# Patient Record
Sex: Male | Born: 2000 | Race: White | Hispanic: No | Marital: Single | Smoking: Current every day smoker
Health system: Southern US, Community
[De-identification: ages and names within clinical notes are randomized; demographics above are authoritative.]

## PROBLEM LIST (undated history)

## (undated) DIAGNOSIS — F419 Anxiety disorder, unspecified: Secondary | ICD-10-CM

## (undated) HISTORY — PX: OTHER SURGICAL HISTORY: SHX169

---
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
## 2019-04-12 ENCOUNTER — Other Ambulatory Visit: Payer: Self-pay

## 2019-04-12 ENCOUNTER — Emergency Department (HOSPITAL_COMMUNITY)
Admission: EM | Admit: 2019-04-12 | Discharge: 2019-04-12 | Disposition: A | Discharge status: Home / Self Care | Payer: No Typology Code available for payment source | Attending: Emergency Medicine | Admitting: Emergency Medicine

## 2019-04-12 ENCOUNTER — Encounter (HOSPITAL_COMMUNITY): Payer: Self-pay

## 2019-04-12 ENCOUNTER — Emergency Department (HOSPITAL_COMMUNITY): Payer: Self-pay | Attending: Physician Assistant

## 2019-04-12 DIAGNOSIS — Y990 Civilian activity done for income or pay: Secondary | ICD-10-CM | POA: Insufficient documentation

## 2019-04-12 DIAGNOSIS — Y9389 Activity, other specified: Secondary | ICD-10-CM | POA: Diagnosis not present

## 2019-04-12 DIAGNOSIS — T1511XA Foreign body in conjunctival sac, right eye, initial encounter: Secondary | ICD-10-CM | POA: Diagnosis not present

## 2019-04-12 DIAGNOSIS — Y9289 Other specified places as the place of occurrence of the external cause: Secondary | ICD-10-CM | POA: Insufficient documentation

## 2019-04-12 DIAGNOSIS — S0591XA Unspecified injury of right eye and orbit, initial encounter: Secondary | ICD-10-CM | POA: Diagnosis present

## 2019-04-12 DIAGNOSIS — W311XXA Contact with metalworking machines, initial encounter: Secondary | ICD-10-CM | POA: Diagnosis not present

## 2019-04-12 DIAGNOSIS — T1591XA Foreign body on external eye, part unspecified, right eye, initial encounter: Secondary | ICD-10-CM

## 2019-04-12 MED ORDER — ACETAMINOPHEN 500 MG PO TABS
1000.0000 mg | ORAL_TABLET | Freq: Once | ORAL | Status: AC
  Administered 2019-04-12: 11:00:00 1000 mg via ORAL
  Filled 2019-04-12: qty 2

## 2019-04-12 MED ORDER — IBUPROFEN 400 MG PO TABS
400.0000 mg | ORAL_TABLET | Freq: Once | ORAL | Status: AC
  Administered 2019-04-12: 11:00:00 400 mg via ORAL
  Filled 2019-04-12: qty 1

## 2019-04-12 MED ORDER — TOBRAMYCIN 0.3 % OP SOLN
2.0000 [drp] | Freq: Once | OPHTHALMIC | Status: AC
  Administered 2019-04-12: 11:00:00 2 [drp] via OPHTHALMIC
  Filled 2019-04-12: qty 5

## 2019-04-12 MED ORDER — FLUORESCEIN SODIUM 1 MG OP STRP
1.0000 | ORAL_STRIP | Freq: Once | OPHTHALMIC | Status: AC
  Administered 2019-04-12: 09:00:00 1 via OPHTHALMIC
  Filled 2019-04-12: qty 1

## 2019-04-12 MED ORDER — TETRACAINE HCL 0.5 % OP SOLN
2.0000 [drp] | Freq: Once | OPHTHALMIC | Status: AC
  Administered 2019-04-12: 2 [drp] via OPHTHALMIC
  Filled 2019-04-12: qty 4

## 2019-04-12 NOTE — Discharge Instructions (Addendum)
He had a small piece of metal lodged in your right eye.  This was removed.  Only a portion of the rust ring was removed.  Please see the ophthalmology specialist Dr Manuella Ghazi for assistance with removal of this rust ring.  Please use 2 drops of tobramycin to the right eye every 4 hours for the next 5 days.  Please use dark glasses over the next few days to improve the pain when you change from light to dark, or dark to light settings.  Use Tylenol every 4 hours or ibuprofen every 6 hours for headache.  Please return to the emergency department if any emergent changes in your condition, or changes in your symptoms.

## 2019-04-12 NOTE — ED Triage Notes (Signed)
Pt works at [employer] and sent to ED due to piece of metal in right eye since yesterday. Pt will needs drug screens. Right eye noted to be red and watery

## 2019-04-12 NOTE — ED Provider Notes (Signed)
[facility] EMERGENCY DEPARTMENT Provider Note   CSN: [id number] Arrival date & time: 04/12/19  [id number]     History   Chief Complaint Chief Complaint  Patient presents with  . Eye Pain    HPI Roy Freeman is a 18 y.o. male.     Patient is a 18 year old male who presents to the emergency department with complaint of eye pain and injury.  The patient states that on yesterday October 6 he was grinding some metal.  I piece of metal, or shaving went into his right eye.  He irrigated the area, but continued to have watering of the eye.  He had pain and watering of the eye during the night last night.  Today he had increased redness.  He has some photophobia present, and some increasing pain.  He states that his tetanus status is up-to-date.  Review of medications is negative for any anticoagulation medications.  There is no noted previous eye surgery noted.  The patient presents now for assistance with this issue.  The history is provided by the patient.    History reviewed. No pertinent past medical history.  There are no active problems to display for this patient.   Past Surgical History:  Procedure Laterality Date  . spleen sx due to cyst          Home Medications    Prior to Admission medications   Not on File    Family History No family history on file.  Social History Social History   Tobacco Use  . Smoking status: Never Smoker  Substance Use Topics  . Alcohol use: Not Currently  . Drug use: Never     Allergies   Patient has no allergy information on record.   Review of Systems Review of Systems  Constitutional: Negative for activity change and appetite change.  HENT: Negative for congestion, ear discharge, ear pain, facial swelling, nosebleeds, rhinorrhea, sneezing and tinnitus.   Eyes: Positive for photophobia, pain and redness. Negative for discharge.  Respiratory: Negative for cough, choking, shortness of breath and wheezing.    Cardiovascular: Negative for chest pain, palpitations and leg swelling.  Gastrointestinal: Negative for abdominal pain, blood in stool, constipation, diarrhea, nausea and vomiting.  Genitourinary: Negative for difficulty urinating, dysuria, flank pain, frequency and hematuria.  Musculoskeletal: Negative for back pain, gait problem, myalgias and neck pain.  Skin: Negative for color change, rash and wound.  Neurological: Negative for dizziness, seizures, syncope, facial asymmetry, speech difficulty, weakness and numbness.  Hematological: Negative for adenopathy. Does not bruise/bleed easily.  Psychiatric/Behavioral: Negative for agitation, confusion, hallucinations, self-injury and suicidal ideas. The patient is not nervous/anxious.      Physical Exam Updated Vital Signs BP 117/77 (BP Location: Left Arm)   Pulse 72   Temp 98.2 F (36.8 C) (Oral)   Resp 16   Ht 6\' 1"  (1.854 m)   Wt 65.8 kg   SpO2 98%   BMI 19.13 kg/m   Physical Exam Vitals signs and nursing note reviewed.  Constitutional:      Appearance: He is well-developed. He is not toxic-appearing.  HENT:     Head: Normocephalic.     Right Ear: Tympanic membrane and external ear normal.     Left Ear: Tympanic membrane and external ear normal.  Eyes:     General: Lids are normal. Lids are everted, no foreign bodies appreciated. No visual field deficit.    Extraocular Movements: Extraocular movements intact.     Conjunctiva/sclera:  Right eye: Right conjunctiva is injected.     Left eye: Left conjunctiva is not injected.     Pupils: Pupils are equal, round, and reactive to light.  Neck:     Musculoskeletal: Normal range of motion and neck supple.     Vascular: No carotid bruit.  Cardiovascular:     Rate and Rhythm: Normal rate and regular rhythm.     Pulses: Normal pulses.     Heart sounds: Normal heart sounds.  Pulmonary:     Effort: No respiratory distress.     Breath sounds: Normal breath sounds.  Abdominal:      General: Bowel sounds are normal.     Palpations: Abdomen is soft.     Tenderness: There is no abdominal tenderness. There is no guarding.  Musculoskeletal: Normal range of motion.  Lymphadenopathy:     Head:     Right side of head: No submandibular adenopathy.     Left side of head: No submandibular adenopathy.     Cervical: No cervical adenopathy.  Skin:    General: Skin is warm and dry.  Neurological:     Mental Status: He is alert and oriented to person, place, and time.     Cranial Nerves: No cranial nerve deficit.     Sensory: No sensory deficit.  Psychiatric:        Speech: Speech normal.      ED Treatments / Results  Labs (all labs ordered are listed, but only abnormal results are displayed) Labs Reviewed - No data to display  EKG None  Radiology No results found.  Procedures .Foreign Body Removal  Date/Time: 04/12/2019 10:35 AM Performed by: Ivery Quale, PA-C Authorized by: Ivery Quale, PA-C  Consent: Verbal consent obtained. Consent given by: patient Patient understanding: patient states understanding of the procedure being performed Imaging studies: imaging studies available Patient identity confirmed: arm band Time out: Immediately prior to procedure a "time out" was called to verify the correct patient, procedure, equipment, support staff and site/side marked as required. Body area: eye Location details: right conjunctiva  Anesthesia: Local Anesthetic: tetracaine drops Patient restrained: no Patient cooperative: yes Localization method: slit lamp Removal mechanism: 27-gauge needle Eye examined with fluorescein. Corneal abrasion size: medium Corneal abrasion location: central Residual rust ring present. Residual rust ring removed: attempt to remove rust ring aborted. Dressing: antibiotic drops Depth: embedded Complexity: complex 1 objects recovered. Objects recovered: metal shaving Post-procedure assessment: foreign body removed  (residual rust ring not removed.) Patient tolerance: patient tolerated the procedure well with no immediate complications   (including critical care time)  Medications Ordered in ED Medications - No data to display   Initial Impression / Assessment and Plan / ED Course  I have reviewed the triage vital signs and the nursing notes.  Pertinent labs & imaging results that were available during my care of the patient were reviewed by me and considered in my medical decision making (see chart for details).          Final Clinical Impressions(s) / ED Diagnoses  MDM  Patient sustained a foreign body in the right eye while grinding metal on yesterday.  Patient states that the tetanus is up-to-date.  Visual acuity reviewed.  CT scan is negative for retained metallic foreign body or signs of significant traumatic injury.  The foreign body to the right eye was removed.  The attempt to remove the rust ring had to be aborted.  Patient is referred to Dr.Shah for ophthalmology evaluation and management.  Patient placed on tobramycin ophthalmic drops.  Patient will use Tylenol and ibuprofen for headache.  I have asked the patient to use dark glasses to assist with pain and discomfort when changing light settings.  The patient will return to the emergency department if any changes in his condition, worsening of symptoms, problems or concerns.   Final diagnoses:  Foreign body, eye, right, initial encounter    ED Discharge Orders    None       Lily Kocher, PA-C 04/12/19 1046    Sherwood Gambler, MD 04/14/19 1542

## 2020-02-09 IMAGING — CT CT ORBITS W/O CM
3 series · 14 of 47 positions shown, 16 images · non-contrast
Comparison: None.

CLINICAL DATA: 17-year-old male with injury to the right eye
yesterday while grinding metal. Evaluate for retained metallic
foreign body.

EXAM:
CT ORBITS WITHOUT CONTRAST
TECHNIQUE: Multidetector CT images were obtained using the standard protocol
without intravenous contrast.

[Series 2: orbits soft · axial · 0.35mm/px · z∈[+32,+110]mm · 8 of 47 slices shown, 10 images]
[im 4/47  brain]
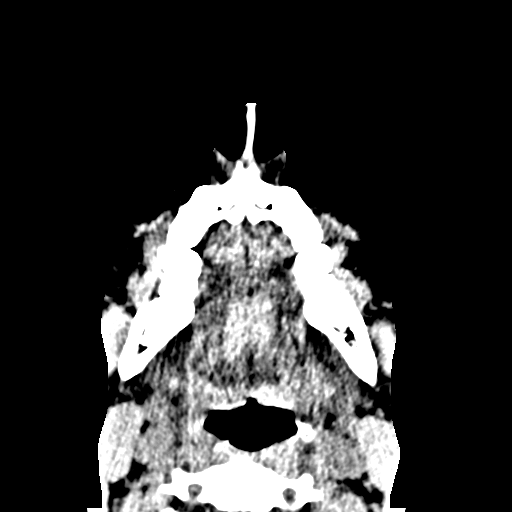
[im 4/47  bone]
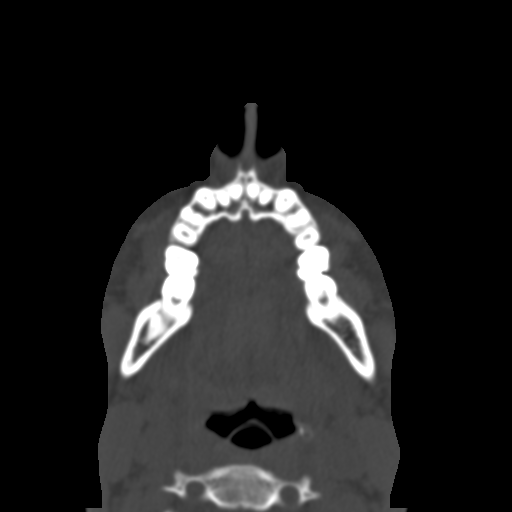
[im 10/47  bone]
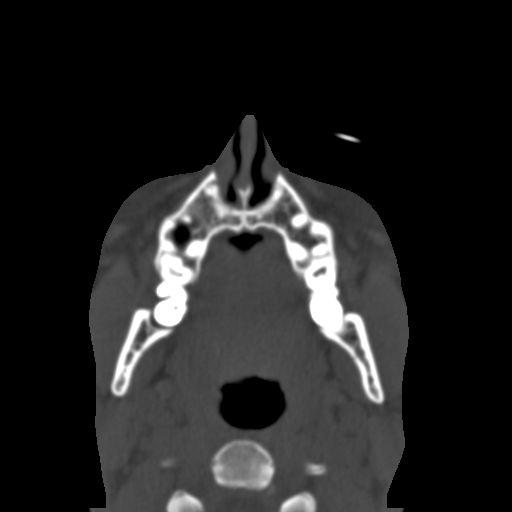
[im 15/47  bone]
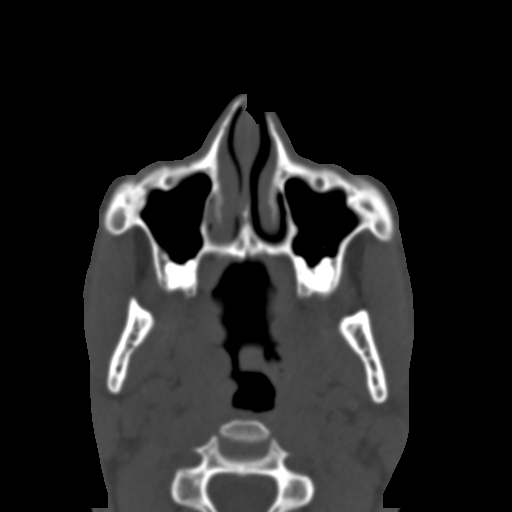
[im 21/47  bone]
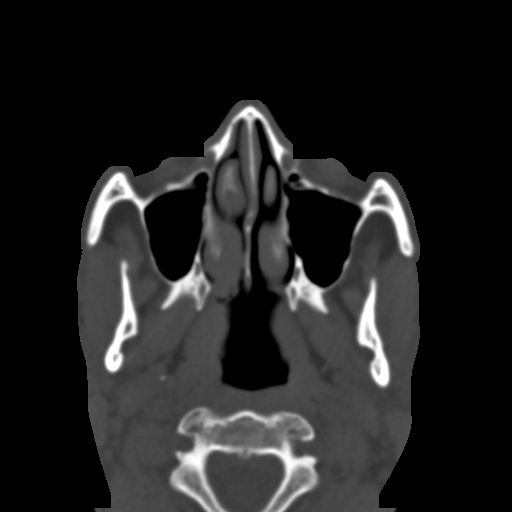
[im 26/47  brain]
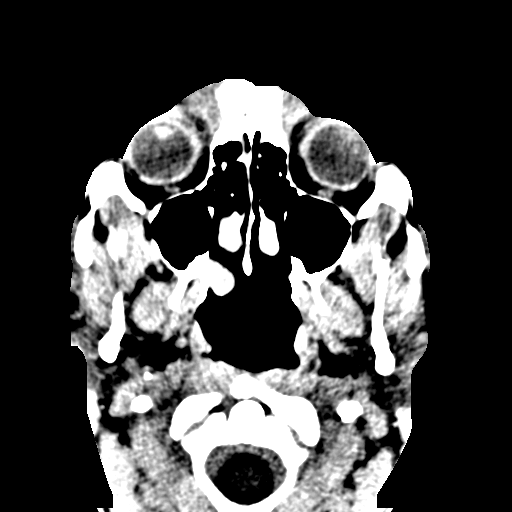
[im 26/47  bone]
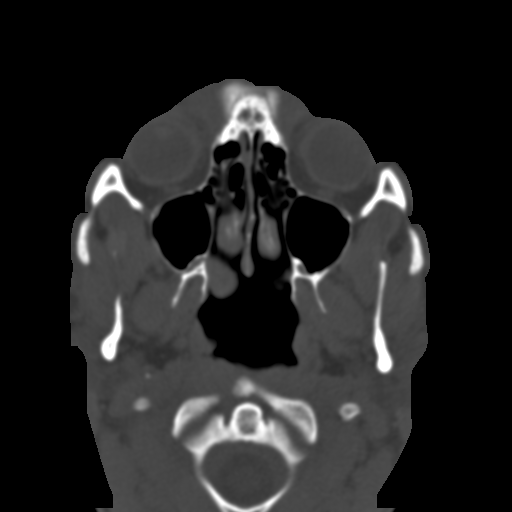
[im 32/47  bone]
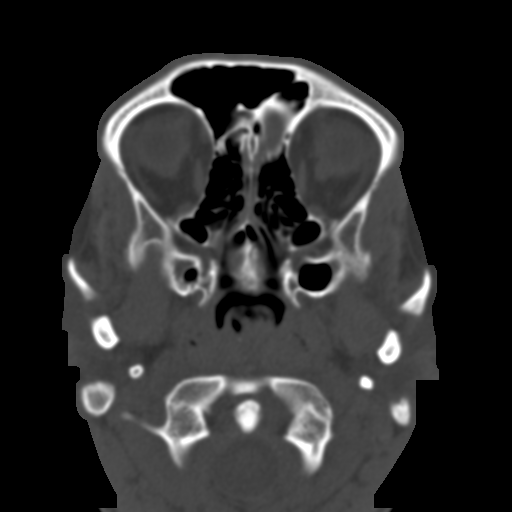
[im 37/47  bone]
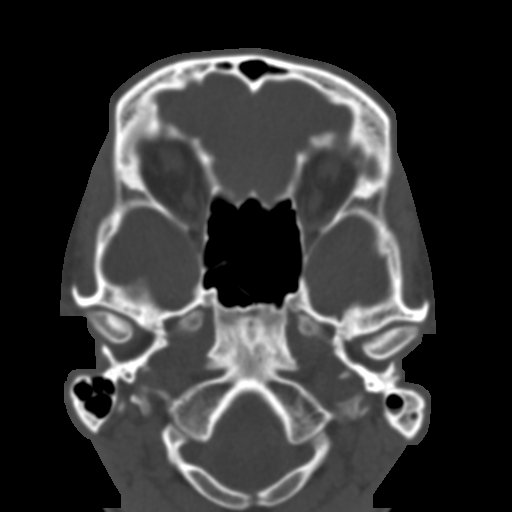
[im 43/47  bone]
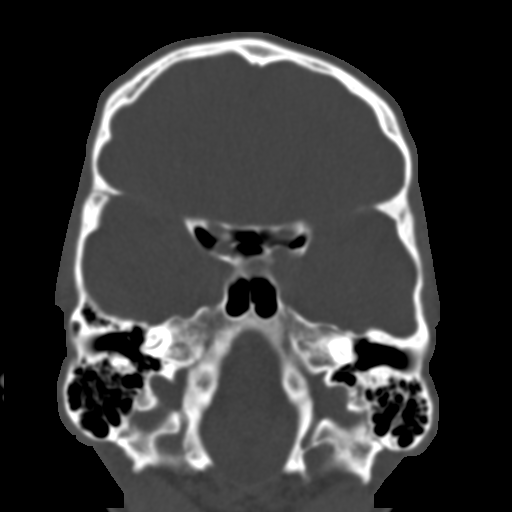

[Series 6: coronal soft · coronal · 0.21mm/px · 3 of 96 slices shown]
[im 32/96  bone]
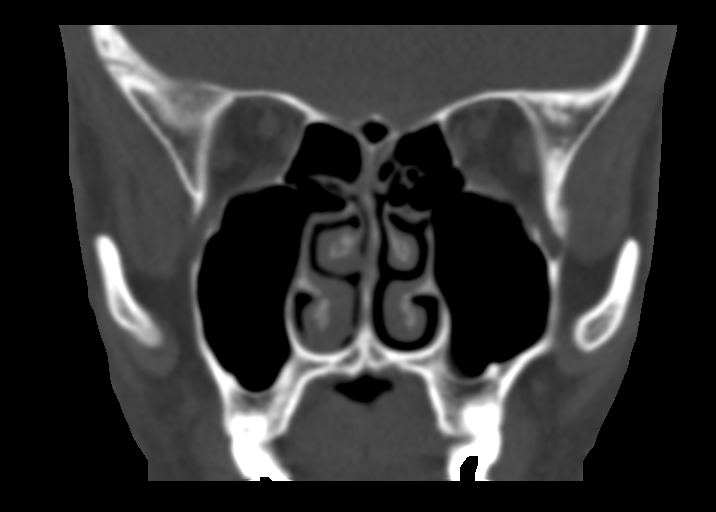
[im 43/96  bone]
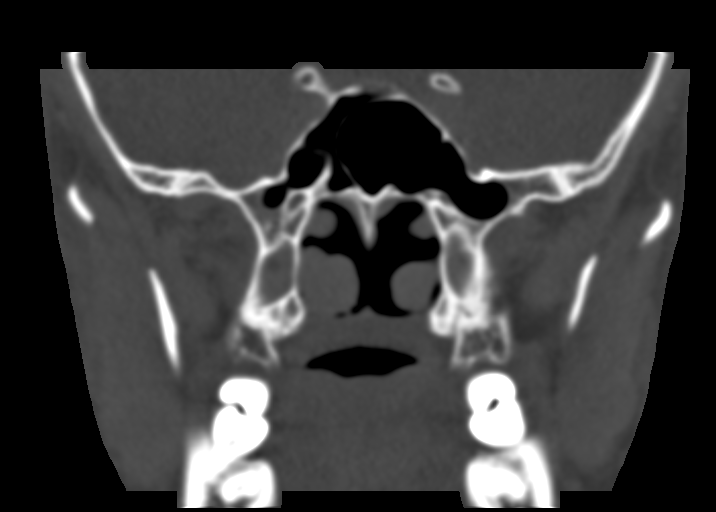
[im 53/96  bone]
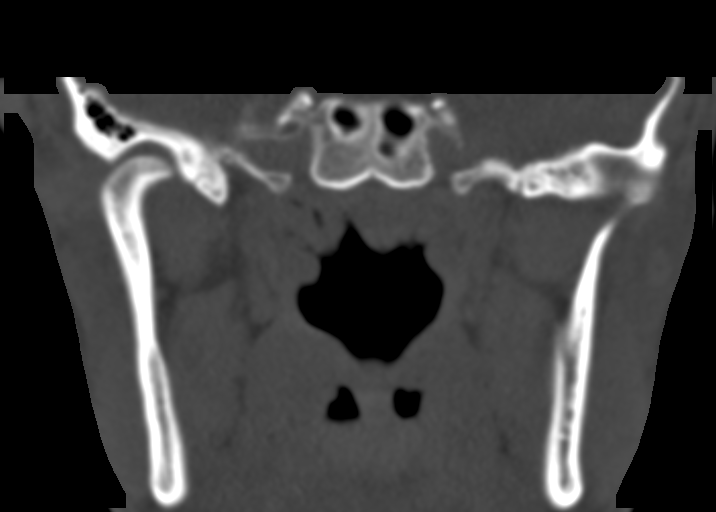

[Series 7: sagittal soft · sagittal · 0.21mm/px · 3 of 76 slices shown]
[im 26/76  bone]
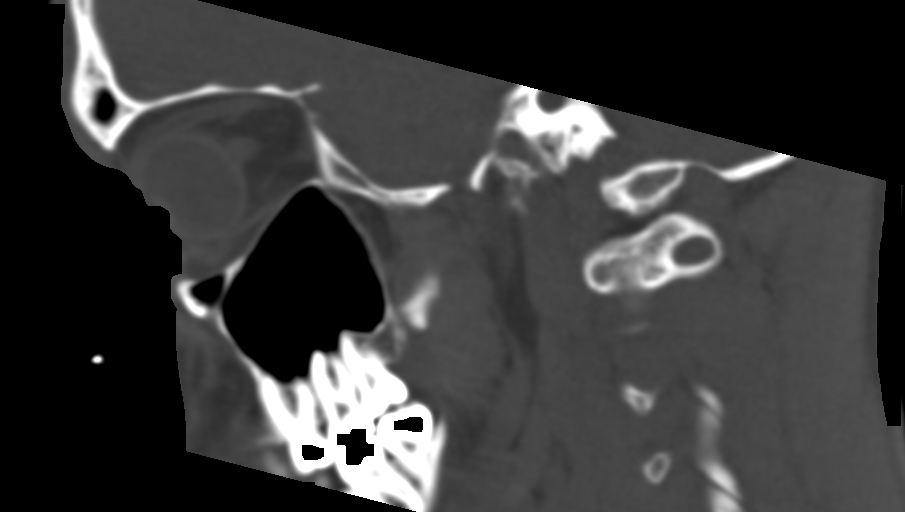
[im 38/76  bone]
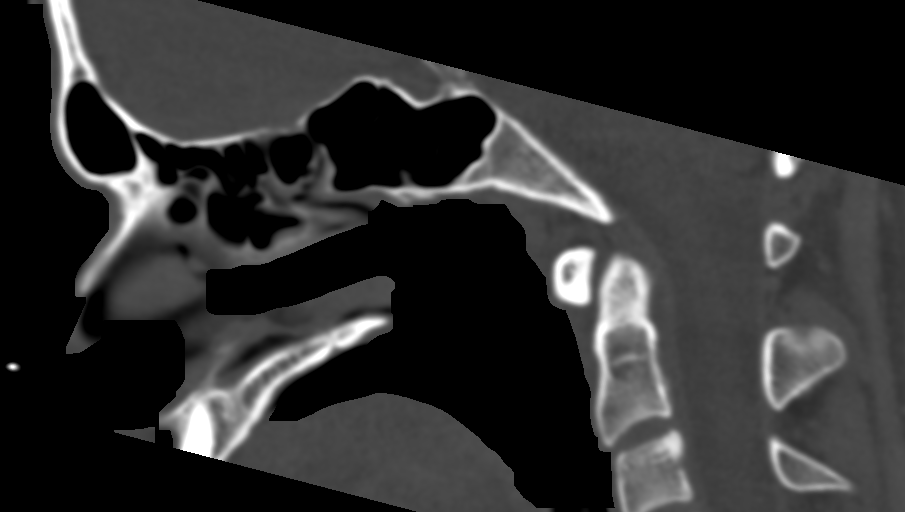
[im 51/76  bone]
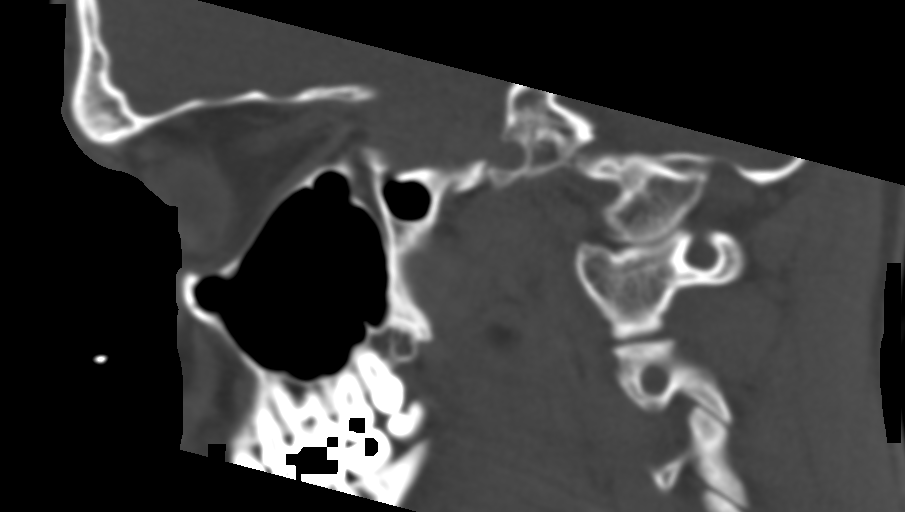

[14 of 47 positions shown; findings below may reference images not displayed]

FINDINGS: Orbits: No traumatic or inflammatory finding. Globes, optic nerves,
orbital fat, extraocular muscles, vascular structures, and lacrimal
glands are normal.

Visualized sinuses: Clear.

Soft tissues: Negative.

Limited intracranial: No significant or unexpected finding.
IMPRESSION: 1. No retained metallic foreign body, or signs of significant acute
traumatic injury to the right globe or orbit.

## 2023-11-09 ENCOUNTER — Encounter: Payer: Self-pay | Admitting: Surgery

## 2023-11-09 ENCOUNTER — Ambulatory Visit: Payer: Self-pay | Admitting: Surgery

## 2023-11-09 VITALS — BP 115/76 | HR 81 | Temp 97.7°F | Resp 12 | Ht 73.0 in | Wt 157.0 lb

## 2023-11-09 DIAGNOSIS — K429 Umbilical hernia without obstruction or gangrene: Secondary | ICD-10-CM | POA: Diagnosis not present

## 2023-11-12 NOTE — H&P (Signed)
 [facility] History and Physical  Reason for Referral: Umbilical hernia Referring Physician: Dr. Lorraine Roses  Chief Complaint   New Patient (Initial Visit)     Roy Freeman is a 23 y.o. male.  HPI: Patient presents for evaluation of an umbilical hernia.  He states that he first noticed it about a year and a half ago and he was previously able to reduce the hernia at that time.  It initially was the size of a dime and it has now increased to the size of a nickel.  2 weeks ago, was when he first noted pain at the area and he was unable to reduce the hernia.  He denies fever, chills, nausea, and vomiting.  He is tolerating a diet without issue and he is moving his bowels regularly.  He has no significant past medical history other than anxiety.  His surgical history is significant for a laparoscopic splenic cyst excision in 2019.  He uses a vape and goes through a cartridge a week.  He denies use of alcohol and illicit drugs.  No past medical history on file.  Past Surgical History:  Procedure Laterality Date   spleen sx due to cyst      No family history on file.  Social History   Tobacco Use   Smoking status: Never  Vaping Use   Vaping status: Every Day  Substance Use Topics   Alcohol use: Not Currently   Drug use: Never    Medications: I have reviewed the patient's current medications. Allergies as of 11/09/2023   No Known Allergies      Medication List        Accurate as of Nov 09, 2023 11:59 PM. If you have any questions, ask your nurse or doctor.          cetirizine 10 MG tablet Commonly known as: ZYRTEC Take 10 mg by mouth daily as needed for allergies.   clonazePAM 0.5 MG tablet Commonly known as: KLONOPIN Take 0.5 mg by mouth daily as needed for anxiety.   sertraline 50 MG tablet Commonly known as: ZOLOFT Take 50 mg by mouth daily.         ROS:  Constitutional: negative for chills, fatigue, and fevers Eyes: negative for visual  disturbance and pain Ears, nose, mouth, throat, and face: negative for ear drainage, sore throat, and sinus problems Respiratory: negative for cough, wheezing, and shortness of breath Cardiovascular: negative for chest pain and palpitations Gastrointestinal: positive for abdominal pain, negative for nausea, reflux symptoms, and vomiting Genitourinary:negative for dysuria and frequency Integument/breast: negative for dryness and rash Hematologic/lymphatic: negative for bleeding and lymphadenopathy Musculoskeletal:negative for back pain and neck pain Neurological: negative for dizziness and tremors Endocrine: negative for temperature intolerance  Blood pressure 115/76, pulse 81, temperature 97.7 F (36.5 C), temperature source Oral, resp. rate 12, height 6\' 1"  (1.854 m), weight 157 lb (71.2 kg), SpO2 96%. Physical Exam Vitals reviewed.  Constitutional:      Appearance: Normal appearance.  HENT:     Head: Normocephalic and atraumatic.  Eyes:     Extraocular Movements: Extraocular movements intact.     Pupils: Pupils are equal, round, and reactive to light.  Cardiovascular:     Rate and Rhythm: Normal rate and regular rhythm.  Pulmonary:     Effort: Pulmonary effort is normal.     Breath sounds: Normal breath sounds.  Abdominal:     Comments: Abdomen soft, nondistended, no percussion tenderness, nontender patient; no rigidity, guarding, rebound tenderness;  soft umbilical hernia, unable to be reduced, 1 cm in size, minimal tenderness to palpation  Musculoskeletal:        General: Normal range of motion.     Cervical back: Normal range of motion.  Skin:    General: Skin is warm and dry.  Neurological:     General: No focal deficit present.     Mental Status: He is alert and oriented to person, place, and time.  Psychiatric:        Mood and Affect: Mood normal.        Behavior: Behavior normal.     Results: No results found for this or any previous visit (from the past 48  hours).  No results found.   Assessment & Plan:  [name] is a 23 y.o. male who presents for evaluation of an umbilical hernia.  -I discussed the pathophysiology of umbilical hernias and we discussed the need for surgical repair -The risk and benefits of robotic assisted laparoscopic umbilical hernia repair with mesh were discussed including but not limited to bleeding, infection, injury to surrounding structures, need for additional procedures, and hernia recurrence.  After careful consideration, Lue Mayeaux has decided to proceed with surgery.  -Patient tentatively scheduled for surgery on 5/16 -Information provided to the patient regarding umbilical hernias -Advised that the patient should present to the ED if they begin to have painful nonreducible bulge at umbilicus, overlying skin changes, nausea, vomiting, and obstipation  All questions were answered to the satisfaction of the patient and family.  Note: Portions of this report may have been transcribed using voice recognition software. Every effort has been made to ensure accuracy; however, inadvertent computerized transcription errors may still be present.   Lidia Reels, DO [facility] [address] [place], [place] [address] [phone] (office)

## 2023-11-12 NOTE — Progress Notes (Signed)
 [facility] History and Physical  Reason for Referral: Umbilical hernia Referring Physician: Dr. Lorraine Roses  Chief Complaint   New Patient (Initial Visit)     Roy Freeman is a 23 y.o. male.  HPI: Patient presents for evaluation of an umbilical hernia.  He states that he first noticed it about a year and a half ago and he was previously able to reduce the hernia at that time.  It initially was the size of a dime and it has now increased to the size of a nickel.  2 weeks ago, was when he first noted pain at the area and he was unable to reduce the hernia.  He denies fever, chills, nausea, and vomiting.  He is tolerating a diet without issue and he is moving his bowels regularly.  He has no significant past medical history other than anxiety.  His surgical history is significant for a laparoscopic splenic cyst excision in 2019.  He uses a vape and goes through a cartridge a week.  He denies use of alcohol and illicit drugs.  No past medical history on file.  Past Surgical History:  Procedure Laterality Date   spleen sx due to cyst      No family history on file.  Social History   Tobacco Use   Smoking status: Never  Vaping Use   Vaping status: Every Day  Substance Use Topics   Alcohol use: Not Currently   Drug use: Never    Medications: I have reviewed the patient's current medications. Allergies as of 11/09/2023   No Known Allergies      Medication List        Accurate as of Nov 09, 2023 11:59 PM. If you have any questions, ask your nurse or doctor.          cetirizine 10 MG tablet Commonly known as: ZYRTEC Take 10 mg by mouth daily as needed for allergies.   clonazePAM 0.5 MG tablet Commonly known as: KLONOPIN Take 0.5 mg by mouth daily as needed for anxiety.   sertraline 50 MG tablet Commonly known as: ZOLOFT Take 50 mg by mouth daily.         ROS:  Constitutional: negative for chills, fatigue, and fevers Eyes: negative for visual  disturbance and pain Ears, nose, mouth, throat, and face: negative for ear drainage, sore throat, and sinus problems Respiratory: negative for cough, wheezing, and shortness of breath Cardiovascular: negative for chest pain and palpitations Gastrointestinal: positive for abdominal pain, negative for nausea, reflux symptoms, and vomiting Genitourinary:negative for dysuria and frequency Integument/breast: negative for dryness and rash Hematologic/lymphatic: negative for bleeding and lymphadenopathy Musculoskeletal:negative for back pain and neck pain Neurological: negative for dizziness and tremors Endocrine: negative for temperature intolerance  Blood pressure 115/76, pulse 81, temperature 97.7 F (36.5 C), temperature source Oral, resp. rate 12, height 6\' 1"  (1.854 m), weight 157 lb (71.2 kg), SpO2 96%. Physical Exam Vitals reviewed.  Constitutional:      Appearance: Normal appearance.  HENT:     Head: Normocephalic and atraumatic.  Eyes:     Extraocular Movements: Extraocular movements intact.     Pupils: Pupils are equal, round, and reactive to light.  Cardiovascular:     Rate and Rhythm: Normal rate and regular rhythm.  Pulmonary:     Effort: Pulmonary effort is normal.     Breath sounds: Normal breath sounds.  Abdominal:     Comments: Abdomen soft, nondistended, no percussion tenderness, nontender patient; no rigidity, guarding, rebound tenderness;  soft umbilical hernia, unable to be reduced, 1 cm in size, minimal tenderness to palpation  Musculoskeletal:        General: Normal range of motion.     Cervical back: Normal range of motion.  Skin:    General: Skin is warm and dry.  Neurological:     General: No focal deficit present.     Mental Status: He is alert and oriented to person, place, and time.  Psychiatric:        Mood and Affect: Mood normal.        Behavior: Behavior normal.     Results: No results found for this or any previous visit (from the past 48  hours).  No results found.   Assessment & Plan:  Roy Freeman is a 23 y.o. male who presents for evaluation of an umbilical hernia.  -I discussed the pathophysiology of umbilical hernias and we discussed the need for surgical repair -The risk and benefits of robotic assisted laparoscopic umbilical hernia repair with mesh were discussed including but not limited to bleeding, infection, injury to surrounding structures, need for additional procedures, and hernia recurrence.  After careful consideration, Roy Freeman has decided to proceed with surgery.  -Patient tentatively scheduled for surgery on 5/16 -Information provided to the patient regarding umbilical hernias -Advised that the patient should present to the ED if they begin to have painful nonreducible bulge at umbilicus, overlying skin changes, nausea, vomiting, and obstipation  All questions were answered to the satisfaction of the patient and family.  Note: Portions of this report may have been transcribed using voice recognition software. Every effort has been made to ensure accuracy; however, inadvertent computerized transcription errors may still be present.   Lidia Reels, DO [facility] [address] [place], [place] [address] [phone] (office)

## 2023-11-16 ENCOUNTER — Encounter (HOSPITAL_COMMUNITY): Payer: Self-pay

## 2023-11-16 ENCOUNTER — Other Ambulatory Visit: Payer: Self-pay

## 2023-11-16 ENCOUNTER — Encounter (HOSPITAL_COMMUNITY): Admission: RE | Admit: 2023-11-16 | Source: Ambulatory Visit

## 2023-11-16 NOTE — Pre-Procedure Instructions (Signed)
 PAT completed over the phone with the patient today for his surgery on 11/19/23.

## 2023-11-18 ENCOUNTER — Encounter (HOSPITAL_COMMUNITY)
Admission: RE | Admit: 2023-11-18 | Discharge: 2023-11-18 | Disposition: A | Discharge status: Home / Self Care | Source: Ambulatory Visit | Attending: Surgery | Admitting: Surgery

## 2023-11-18 HISTORY — DX: Anxiety disorder, unspecified: F41.9

## 2023-11-19 ENCOUNTER — Other Ambulatory Visit: Payer: Self-pay

## 2023-11-19 ENCOUNTER — Encounter (HOSPITAL_COMMUNITY): Payer: Self-pay | Admitting: Surgery

## 2023-11-19 ENCOUNTER — Encounter (HOSPITAL_COMMUNITY)
Admission: RE | Disposition: A | Discharge status: Home / Self Care | Payer: Self-pay | Source: Home / Self Care | Attending: Surgery

## 2023-11-19 ENCOUNTER — Ambulatory Visit (HOSPITAL_BASED_OUTPATIENT_CLINIC_OR_DEPARTMENT_OTHER)

## 2023-11-19 ENCOUNTER — Ambulatory Visit (HOSPITAL_COMMUNITY)
Admission: RE | Admit: 2023-11-19 | Discharge: 2023-11-19 | Disposition: A | Discharge status: Home / Self Care | Attending: Surgery | Admitting: Surgery

## 2023-11-19 ENCOUNTER — Ambulatory Visit (HOSPITAL_COMMUNITY)

## 2023-11-19 DIAGNOSIS — F419 Anxiety disorder, unspecified: Secondary | ICD-10-CM | POA: Insufficient documentation

## 2023-11-19 DIAGNOSIS — I10 Essential (primary) hypertension: Secondary | ICD-10-CM | POA: Insufficient documentation

## 2023-11-19 DIAGNOSIS — F17290 Nicotine dependence, other tobacco product, uncomplicated: Secondary | ICD-10-CM | POA: Insufficient documentation

## 2023-11-19 DIAGNOSIS — K420 Umbilical hernia with obstruction, without gangrene: Secondary | ICD-10-CM | POA: Diagnosis not present

## 2023-11-19 DIAGNOSIS — K430 Incisional hernia with obstruction, without gangrene: Secondary | ICD-10-CM | POA: Insufficient documentation

## 2023-11-19 SURGERY — REPAIR, HERNIA, UMBILICAL, ROBOT-ASSISTED
Anesthesia: General | Site: Abdomen

## 2023-11-19 MED ORDER — DEXMEDETOMIDINE HCL IN NACL 80 MCG/20ML IV SOLN
INTRAVENOUS | Status: DC | PRN
  Administered 2023-11-19: 12 ug via INTRAVENOUS

## 2023-11-19 MED ORDER — MIDAZOLAM HCL 2 MG/2ML IJ SOLN
INTRAMUSCULAR | Status: DC | PRN
  Administered 2023-11-19: 2 mg via INTRAVENOUS

## 2023-11-19 MED ORDER — HYDROMORPHONE HCL 1 MG/ML IJ SOLN
INTRAMUSCULAR | Status: AC
  Filled 2023-11-19: qty 0.5

## 2023-11-19 MED ORDER — LIDOCAINE 2% (20 MG/ML) 5 ML SYRINGE
INTRAMUSCULAR | Status: DC | PRN
  Administered 2023-11-19: 80 mg via INTRAVENOUS

## 2023-11-19 MED ORDER — CHLORHEXIDINE GLUCONATE CLOTH 2 % EX PADS: 6.0000 | MEDICATED_PAD | Freq: Once | CUTANEOUS | Status: DC

## 2023-11-19 MED ORDER — FENTANYL CITRATE PF 50 MCG/ML IJ SOSY
25.0000 ug | PREFILLED_SYRINGE | INTRAMUSCULAR | Status: DC | PRN
  Administered 2023-11-19 (×2): 50 ug via INTRAVENOUS
  Filled 2023-11-19 (×2): qty 1

## 2023-11-19 MED ORDER — BUPIVACAINE HCL (PF) 0.5 % IJ SOLN
INTRAMUSCULAR | Status: DC | PRN
  Administered 2023-11-19: 30 mL

## 2023-11-19 MED ORDER — CEFAZOLIN SODIUM-DEXTROSE 2-4 GM/100ML-% IV SOLN
2.0000 g | INTRAVENOUS | Status: AC
  Administered 2023-11-19: 2 g via INTRAVENOUS

## 2023-11-19 MED ORDER — BUPIVACAINE HCL (PF) 0.5 % IJ SOLN
INTRAMUSCULAR | Status: AC
  Filled 2023-11-19: qty 30

## 2023-11-19 MED ORDER — LACTATED RINGERS IV SOLN: INTRAVENOUS | Status: DC | PRN

## 2023-11-19 MED ORDER — ONDANSETRON HCL 4 MG/2ML IJ SOLN
INTRAMUSCULAR | Status: DC | PRN
  Administered 2023-11-19: 4 mg via INTRAVENOUS

## 2023-11-19 MED ORDER — CEFAZOLIN SODIUM-DEXTROSE 2-4 GM/100ML-% IV SOLN
INTRAVENOUS | Status: AC
  Filled 2023-11-19: qty 100

## 2023-11-19 MED ORDER — ROCURONIUM BROMIDE 10 MG/ML (PF) SYRINGE
PREFILLED_SYRINGE | INTRAVENOUS | Status: AC
  Filled 2023-11-19: qty 10

## 2023-11-19 MED ORDER — DOCUSATE SODIUM 100 MG PO CAPS: 100.0000 mg | ORAL_CAPSULE | Freq: Two times a day (BID) | ORAL | 2 refills | Status: DC

## 2023-11-19 MED ORDER — LIDOCAINE 2% (20 MG/ML) 5 ML SYRINGE
INTRAMUSCULAR | Status: AC
  Filled 2023-11-19: qty 5

## 2023-11-19 MED ORDER — DEXAMETHASONE SODIUM PHOSPHATE 10 MG/ML IJ SOLN
INTRAMUSCULAR | Status: AC
  Filled 2023-11-19: qty 1

## 2023-11-19 MED ORDER — PROPOFOL 10 MG/ML IV BOLUS
INTRAVENOUS | Status: DC | PRN
  Administered 2023-11-19: 230 mg via INTRAVENOUS
  Administered 2023-11-19: 30 mg via INTRAVENOUS
  Administered 2023-11-19: 40 mg via INTRAVENOUS

## 2023-11-19 MED ORDER — CHLORHEXIDINE GLUCONATE 0.12 % MT SOLN
15.0000 mL | Freq: Once | OROMUCOSAL | Status: AC
  Administered 2023-11-19: 15 mL via OROMUCOSAL

## 2023-11-19 MED ORDER — FENTANYL CITRATE (PF) 100 MCG/2ML IJ SOLN
INTRAMUSCULAR | Status: AC
Start: 2023-11-19 — End: ?
  Filled 2023-11-19: qty 2

## 2023-11-19 MED ORDER — FENTANYL CITRATE (PF) 100 MCG/2ML IJ SOLN
INTRAMUSCULAR | Status: AC
  Filled 2023-11-19: qty 2

## 2023-11-19 MED ORDER — PROPOFOL 10 MG/ML IV BOLUS
INTRAVENOUS | Status: AC
  Filled 2023-11-19: qty 20

## 2023-11-19 MED ORDER — CHLORHEXIDINE GLUCONATE 0.12 % MT SOLN
OROMUCOSAL | Status: AC
  Filled 2023-11-19: qty 15

## 2023-11-19 MED ORDER — CHLORHEXIDINE GLUCONATE 0.12 % MT SOLN: 15.0000 mL | Freq: Once | OROMUCOSAL | Status: DC

## 2023-11-19 MED ORDER — ONDANSETRON HCL 4 MG PO TABS: 4.0000 mg | ORAL_TABLET | Freq: Every day | ORAL | 1 refills | Status: DC | PRN

## 2023-11-19 MED ORDER — FENTANYL CITRATE (PF) 100 MCG/2ML IJ SOLN
INTRAMUSCULAR | Status: DC | PRN
  Administered 2023-11-19: 25 ug via INTRAVENOUS
  Administered 2023-11-19 (×3): 50 ug via INTRAVENOUS

## 2023-11-19 MED ORDER — LACTATED RINGERS IV SOLN: INTRAVENOUS | Status: DC

## 2023-11-19 MED ORDER — DEXAMETHASONE SODIUM PHOSPHATE 10 MG/ML IJ SOLN
INTRAMUSCULAR | Status: DC | PRN
  Administered 2023-11-19: 10 mg via INTRAVENOUS

## 2023-11-19 MED ORDER — ORAL CARE MOUTH RINSE: 15.0000 mL | Freq: Once | OROMUCOSAL | Status: DC

## 2023-11-19 MED ORDER — SUGAMMADEX SODIUM 200 MG/2ML IV SOLN
INTRAVENOUS | Status: DC | PRN
  Administered 2023-11-19: 400 mg via INTRAVENOUS

## 2023-11-19 MED ORDER — STERILE WATER FOR IRRIGATION IR SOLN
Status: DC | PRN
  Administered 2023-11-19: 500 mL

## 2023-11-19 MED ORDER — ONDANSETRON HCL 4 MG/2ML IJ SOLN: 4.0000 mg | Freq: Once | INTRAMUSCULAR | Status: DC | PRN

## 2023-11-19 MED ORDER — MIDAZOLAM HCL 2 MG/2ML IJ SOLN
INTRAMUSCULAR | Status: AC
  Filled 2023-11-19: qty 2

## 2023-11-19 MED ORDER — HYDROMORPHONE HCL 1 MG/ML IJ SOLN
INTRAMUSCULAR | Status: DC | PRN
Start: 2023-11-19 — End: 2023-11-19
  Administered 2023-11-19 (×2): .5 mg via INTRAVENOUS

## 2023-11-19 MED ORDER — ONDANSETRON HCL 4 MG/2ML IJ SOLN
INTRAMUSCULAR | Status: AC
  Filled 2023-11-19: qty 2

## 2023-11-19 MED ORDER — HYDROCODONE-ACETAMINOPHEN 7.5-325 MG PO TABS
1.0000 | ORAL_TABLET | Freq: Once | ORAL | Status: AC | PRN
  Administered 2023-11-19: 1 via ORAL
  Filled 2023-11-19: qty 1

## 2023-11-19 MED ORDER — HYDROCODONE-ACETAMINOPHEN 5-325 MG PO TABS: 1.0000 | ORAL_TABLET | Freq: Four times a day (QID) | ORAL | 0 refills | Status: DC | PRN

## 2023-11-19 MED ORDER — ACETAMINOPHEN 500 MG PO TABS: 500.0000 mg | ORAL_TABLET | Freq: Four times a day (QID) | ORAL | 0 refills | Status: AC

## 2023-11-19 MED ORDER — ROCURONIUM BROMIDE 10 MG/ML (PF) SYRINGE
PREFILLED_SYRINGE | INTRAVENOUS | Status: DC | PRN
Start: 2023-11-19 — End: 2023-11-19
  Administered 2023-11-19 (×2): 20 mg via INTRAVENOUS
  Administered 2023-11-19: 80 mg via INTRAVENOUS
  Administered 2023-11-19: 30 mg via INTRAVENOUS

## 2023-11-19 SURGICAL SUPPLY — 45 items
BLADE SURG 15 STRL LF DISP TIS (BLADE) ×1 IMPLANT
CHLORAPREP W/TINT 26 (MISCELLANEOUS) ×1 IMPLANT
COVER LIGHT HANDLE STERIS (MISCELLANEOUS) ×2 IMPLANT
COVER MAYO STAND XLG (MISCELLANEOUS) ×1 IMPLANT
COVER TIP SHEARS 8 DVNC (MISCELLANEOUS) ×1 IMPLANT
DEFOGGER SCOPE WARMER CLEARIFY (MISCELLANEOUS) ×1 IMPLANT
DERMABOND ADVANCED .7 DNX12 (GAUZE/BANDAGES/DRESSINGS) ×1 IMPLANT
DRAPE ARM DVNC X/XI (DISPOSABLE) ×3 IMPLANT
DRAPE COLUMN DVNC XI (DISPOSABLE) ×1 IMPLANT
DRIVER NDL MEGA SUTCUT DVNCXI (INSTRUMENTS) ×1 IMPLANT
DRIVER NDLE MEGA SUTCUT DVNCXI (INSTRUMENTS) ×1 IMPLANT
DRSG TEGADERM 4X4.75 (GAUZE/BANDAGES/DRESSINGS) ×1 IMPLANT
ELECTRODE REM PT RTRN 9FT ADLT (ELECTROSURGICAL) ×1 IMPLANT
FORCEPS BPLR 8 MD DVNC XI (FORCEP) ×1 IMPLANT
GAUZE SPONGE 2X2 STRL 8-PLY (GAUZE/BANDAGES/DRESSINGS) IMPLANT
GLOVE BIOGEL PI IND STRL 6.5 (GLOVE) ×2 IMPLANT
GLOVE BIOGEL PI IND STRL 7.0 (GLOVE) ×2 IMPLANT
GLOVE SURG SS PI 6.5 STRL IVOR (GLOVE) ×2 IMPLANT
GOWN STRL REUS W/TWL LRG LVL3 (GOWN DISPOSABLE) ×3 IMPLANT
GRASPER SUT TROCAR 14GX15 (MISCELLANEOUS) IMPLANT
KIT TURNOVER KIT A (KITS) ×1 IMPLANT
MANIFOLD NEPTUNE II (INSTRUMENTS) ×1 IMPLANT
MESH VENTRALIGHT ST 4.5IN (Mesh General) IMPLANT
NDL HYPO 21X1.5 SAFETY (NEEDLE) ×1 IMPLANT
NDL INSUFFLATION 14GA 120MM (NEEDLE) ×1 IMPLANT
NEEDLE HYPO 21X1.5 SAFETY (NEEDLE) ×1 IMPLANT
NEEDLE INSUFFLATION 14GA 120MM (NEEDLE) ×1 IMPLANT
OBTURATOR OPTICALSTD 8 DVNC (TROCAR) ×1 IMPLANT
PACK LAP CHOLE LZT030E (CUSTOM PROCEDURE TRAY) ×1 IMPLANT
PENCIL HANDSWITCHING (ELECTRODE) ×1 IMPLANT
POSITIONER HEAD 8X9X4 ADT (SOFTGOODS) ×1 IMPLANT
SCISSORS MNPLR CVD DVNC XI (INSTRUMENTS) ×1 IMPLANT
SEAL UNIV 5-12 XI (MISCELLANEOUS) ×3 IMPLANT
SET BASIN LINEN APH (SET/KITS/TRAYS/PACK) ×1 IMPLANT
SET TUBE SMOKE EVAC HIGH FLOW (TUBING) ×1 IMPLANT
SUT MNCRL AB 4-0 PS2 18 (SUTURE) ×1 IMPLANT
SUT STRATA 3-0 SH (SUTURE) ×4 IMPLANT
SUT VIC AB 3-0 SH 27X BRD (SUTURE) IMPLANT
SUT VICRYL 0 UR6 27IN ABS (SUTURE) IMPLANT
SUTURE STRATFX 0 PDS+ CT-2 23 (SUTURE) ×1 IMPLANT
SYR 30ML LL (SYRINGE) ×1 IMPLANT
SYSTEM RETRIEVL 5MM INZII UNIV (BASKET) IMPLANT
TAPE TRANSPORE STRL 2 31045 (GAUZE/BANDAGES/DRESSINGS) ×1 IMPLANT
TRAY FOLEY W/BAG SLVR 16FR ST (SET/KITS/TRAYS/PACK) ×1 IMPLANT
WATER STERILE IRR 500ML POUR (IV SOLUTION) ×1 IMPLANT

## 2023-11-19 NOTE — Progress Notes (Signed)
[facility]  Spoke with the patient's family in the consultation room.  I explained that he tolerated the procedure without difficulty.  He has dissolvable stitches under the skin with overlying skin glue.  This will flake off in 10 to 14 days.  I discharged him home with a prescription for narcotic pain medication that they should take as needed for pain.  I also want him taking scheduled Tylenol.  If they take the narcotic pain medication, they should take a stool softener as well.  The patient will follow-up with me in 2 weeks.  All questions were answered to their expressed satisfaction.  Theophilus Kinds, DO [facility] [address] [place], [place] [address] [phone] (office)

## 2023-11-19 NOTE — Anesthesia Procedure Notes (Signed)
 Procedure Name: Intubation Date/Time: 11/19/2023 7:43 AM  Performed by: Coretha Dew, MDPre-anesthesia Checklist: Patient identified, Emergency Drugs available, Suction available, Patient being monitored and Timeout performed Patient Re-evaluated:Patient Re-evaluated prior to induction Oxygen Delivery Method: Circle system utilized Preoxygenation: Pre-oxygenation with 100% oxygen Induction Type: Combination inhalational/ intravenous induction Ventilation: Mask ventilation without difficulty Laryngoscope Size: Miller and 2 Grade View: Grade I Tube type: Oral Tube size: 7.0 mm Number of attempts: 1 Airway Equipment and Method: Stylet Placement Confirmation: ETT inserted through vocal cords under direct vision, positive ETCO2 and breath sounds checked- equal and bilateral Secured at: 22 cm Tube secured with: Tape Dental Injury: Teeth and Oropharynx as per pre-operative assessment

## 2023-11-19 NOTE — Op Note (Signed)
 [facility] Operative Note  11/19/23  Preoperative Diagnosis: Incarcerated incisional hernia   Postoperative Diagnosis: Incarcerated incisional hernia   Procedure(s) Performed: Robotic assisted laparoscopic incisional hernia with mesh, defect size 2 cm   Surgeon: Lidia Reels, DO   Assistants: No qualified resident was available    Anesthesia: General endotracheal   Anesthesiologist: Coretha Dew, MD    Specimens: None   Estimated Blood Loss: Minimal   Blood Replacement: None    Complications: None   Wound Class: Clean   Operative Indications: Patient is a 24 year old male who presents for robotic incisional hernia repair.  He has started to have some pain at the hernia site and notes that he is no longer able to reduce the hernia.  He is agreeable to surgery at this time.  All risks and benefits of performing this procedure were discussed with the patient including pain, infection, bleeding, damage to the surrounding structures, need for more procedures or surgery, and hernia recurrence. The patient voiced understanding of the procedure, all questions were sought and answered, and consent was obtained.  Findings: Incisional hernia just superior to the umbilicus, 2 cm defect Tension free repair achieved with Ventralight 4.5 inch mesh and suture Adequate hemostasis    Procedure: The patient was brought to the operating room and general anesthesia was induced. A time-out was completed verifying correct patient, procedure, site, positioning, and implant(s) and/or special equipment prior to beginning this procedure. Antibiotics were administered prior to making the incision. SCDs placed. The anterior abdominal wall was prepped and draped in the standard sterile fashion.   A stab incision was made at Palmer's point. A towel clip was used to elevate the abdominal wall. A Veress needle placed and the saline drop test was performed. The abdomen was  insufflated to 15cm without issues. 8 mm port was then placed at an incision in the right upper quadrant using the optiview technique.  No injuries were noted from insufflation and port insertion.  2 additional lateral ports on the right side were placed using the optiview ports, placing the 8mm central camera port and an additional 8mm port on in the right lower quadrant with adequate space between.  The [place] robot then docked into place.  The hernia was noted to be just superior to the umbilicus containing omentum. The hernia contents noted and reduced with combination of blunt, sharp dissection with scissors and fenestrated forceps.  Hemostasis achieved throughout this portion.  Once all hernia contents reduced, there was noted to be a 2 cm supraumbilical hernia defect.  Any fat overlying the peritoneal layer was taken down creating flaps to clear the space for adequate fixation of the mesh. During reduction of the hernia sac, there was a small opening noted in the skin at the umbilicus.  This was closed with 3-0 Vicryl suture.  The Ventralight 4.5 inch mesh and sutures were placed in the abdominal cavity under direct visualization.  A transfacial suture with 0 stratafix used to primarily close defect under minimal tension. The stratafix was then passed through the center of the mesh (coated side out) to secured the mesh to the abdominal wall centered over the defect.  The mesh was then circumferentially sutured into the anterior abdominal wall using 3-0 Stratafix x3.  Any bleeding noted during this portion was no longer actively bleeding by end of securing mesh and tightening the suture.    The robot was undocked.  The abdomen was then de-sufflated and the ports were removed.  Marcaine was instilled at the incision sites.  All skin incisions closed with subcuticular 4-0 Monocryl and dermabond. Dermabond was also placed at the umbilicus in addition to a pressure dressing.  Final inspection revealed acceptable  hemostasis.   All counts were correct at the end of the case. The patient was awakened from anesthesia and extubated without complication.  The patient went to the PACU in stable condition.   Lidia Reels, DO [facility] [address] [place], [place] [address] [phone] (office)

## 2023-11-19 NOTE — Discharge Instructions (Addendum)
 Ambulatory Surgery Discharge Instructions  General Anesthesia or Sedation Do not drive or operate heavy machinery for 24 hours.  Do not consume alcohol, tranquilizers, sleeping medications, or any non-prescribed medications for 24 hours. Do not make important decisions or sign any important papers in the next 24 hours. You should have someone with you tonight at home.  Activity  You are advised to go directly home from the hospital.  Restrict your activities and rest for a day.  Resume light activity tomorrow. No heavy lifting over 10 lbs or strenuous exercise. Wear your abdominal binder at all times for the next 1-2 weeks except while sleeping and bathing  Fluids and Diet Begin with clear liquids, bouillon, dry toast, soda crackers.  If not nauseated, you may go to a regular diet when you desire.  Greasy and spicy foods are not advised.  Medications  If you have not had a bowel movement in 24 hours, take 2 tablespoons over the counter Milk of mag.             You May resume your blood thinners tomorrow (Aspirin, coumadin, or other).  You are being discharged with prescriptions for Opioid/Narcotic Medications: There are some specific considerations for these medications that you should know. Opioid Meds have risks & benefits. Addiction to these meds is always a concern with prolonged use Take medication only as directed Do not drive while taking narcotic pain medication Do not crush tablets or capsules Do not use a different container than medication was dispensed in Lock the container of medication in a cool, dry place out of reach of children and pets. Opioid medication can cause addiction Do not share with anyone else (this is a felony) Do not store medications for future use. Dispose of them properly.     Disposal:  Find a [place]  household drug take back site near you.  If you can't get to a drug take back site, use the recipe below as a last resort to dispose of expired,  unused or unwanted drugs. Disposal  (Do not dispose chemotherapy drugs this way, talk to your prescribing doctor instead.) Step 1: Mix drugs (do not crush) with dirt, kitty litter, or used coffee grounds and add a small amount of water to dissolve any solid medications. Step 2: Seal drugs in plastic bag. Step 3: Place plastic bag in trash. Step 4: Take prescription container and scratch out personal information, then recycle or throw away.  Operative Site  You have a liquid bandage over your incisions, this will begin to flake off in about a week. Ok to English as a second language teacher. Keep wound clean and dry. No baths or swimming. No lifting more than 10 pounds.  You also have a dressing at your belly button.  Leave this on for 2-3 days.  May shower overtop of the dressing.  If it becomes saturated with water, remove the dressing  Contact Information: If you have questions or concerns, please call our office, [phone], Monday- Thursday 8AM-5PM and Friday 8AM-12Noon.  If it is after hours or on the weekend, please call [facility], [phone], and ask to speak to the surgeon on call for Dr. Cherilyn Corn at [facility]   SPECIFIC COMPLICATIONS TO WATCH FOR: Inability to urinate Fever over 101? F by mouth Nausea and vomiting lasting longer than 24 hours. Pain not relieved by medication ordered Swelling around the operative site Increased redness, warmth, hardness, around operative area Numbness, tingling, or cold fingers or toes Blood -soaked dressing, (small amounts  of oozing may be normal) Increasing and progressive drainage from surgical area or exam site  Post Anesthesia Home Care Instructions  Activity: Get plenty of rest for the remainder of the day. A responsible individual must stay with you for 24 hours following the procedure.  For the next 24 hours, DO NOT: -Drive a car -Advertising copywriter -Drink alcoholic beverages -Take any medication unless instructed by your physician -Make any  legal decisions or sign important papers.  Meals: Start with liquid foods such as gelatin or soup. Progress to regular foods as tolerated. Avoid greasy, spicy, heavy foods. If nausea and/or vomiting occur, drink only clear liquids until the nausea and/or vomiting subsides. Call your physician if vomiting continues.  Special Instructions/Symptoms: Your throat may feel dry or sore from the anesthesia or the breathing tube placed in your throat during surgery. If this causes discomfort, gargle with warm salt water. The discomfort should disappear within 24 hours.

## 2023-11-19 NOTE — Interval H&P Note (Signed)
 History and Physical Interval Note:  11/19/2023 7:22 AM  Roy Freeman  has presented today for surgery, with the diagnosis of UMBILICAL HERNIA, 2 CM.  The various methods of treatment have been discussed with the patient and family. After consideration of risks, benefits and other options for treatment, the patient has consented to  Procedure(s) with comments: REPAIR, HERNIA, UMBILICAL, ROBOT-ASSISTED (N/A) - W/ MESH as a surgical intervention.  The patient's history has been reviewed, patient examined, no change in status, stable for surgery.  I have reviewed the patient's chart and labs.  Questions were answered to the patient's satisfaction.     Ayanni Tun A Brindy Higginbotham

## 2023-11-19 NOTE — Anesthesia Preprocedure Evaluation (Signed)
 Anesthesia Evaluation  Patient identified by MRN, date of birth, ID band Patient awake    Reviewed: Allergy & Precautions, H&P , NPO status , Patient's Chart, lab work & pertinent test results, reviewed documented beta blocker date and time   Airway Mallampati: II  TM Distance: >3 FB Neck ROM: full    Dental no notable dental hx.    Pulmonary neg pulmonary ROS, Current Smoker   Pulmonary exam normal breath sounds clear to auscultation       Cardiovascular Exercise Tolerance: Good hypertension, negative cardio ROS  Rhythm:regular Rate:Normal     Neuro/Psych   Anxiety     negative neurological ROS  negative psych ROS   GI/Hepatic negative GI ROS, Neg liver ROS,,,  Endo/Other  negative endocrine ROS    Renal/GU negative Renal ROS  negative genitourinary   Musculoskeletal   Abdominal   Peds  Hematology negative hematology ROS (+)   Anesthesia Other Findings   Reproductive/Obstetrics negative OB ROS                             Anesthesia Physical Anesthesia Plan  ASA: 2  Anesthesia Plan: General and General ETT   Post-op Pain Management:    Induction:   PONV Risk Score and Plan: Ondansetron  Airway Management Planned:   Additional Equipment:   Intra-op Plan:   Post-operative Plan:   Informed Consent: I have reviewed the patients History and Physical, chart, labs and discussed the procedure including the risks, benefits and alternatives for the proposed anesthesia with the patient or authorized representative who has indicated his/her understanding and acceptance.     Dental Advisory Given  Plan Discussed with: CRNA  Anesthesia Plan Comments:        Anesthesia Quick Evaluation

## 2023-11-19 NOTE — Progress Notes (Signed)
 Patient falling off to sleep snoring.

## 2023-11-19 NOTE — Transfer of Care (Signed)
 Immediate Anesthesia Transfer of Care Note  Patient: Roy Freeman  Procedure(s) Performed: REPAIR, HERNIA, EXCISIONAL, ROBOT-ASSISTED (Abdomen)  Patient Location: PACU  Anesthesia Type:General  Level of Consciousness: drowsy  Airway & Oxygen Therapy: Patient Spontanous Breathing and Patient connected to face mask oxygen  Post-op Assessment: Report given to RN and Post -op Vital signs reviewed and stable  Post vital signs: Reviewed and stable  Last Vitals:  Vitals Value Taken Time  BP 110/60 11/19/23 1045  Temp 37.1 C 11/19/23 1044  Pulse 79 11/19/23 1050  Resp 14 11/19/23 1050  SpO2 100 % 11/19/23 1050  Vitals shown include unfiled device data.  Last Pain:  Vitals:   11/19/23 1044  TempSrc:   PainSc: Asleep         Complications: There were no known notable events for this encounter.

## 2023-11-24 ENCOUNTER — Telehealth: Payer: Self-pay | Admitting: Family Medicine

## 2023-11-24 NOTE — Telephone Encounter (Signed)
 STD/FMLA paperwork completed and faxed to [facility] on behalf of Roy Freeman and [facility] [facility] at [phone].  Out of work starting 11/19/2023 and may return to work unrestricted on 01/03/2024.

## 2023-12-02 ENCOUNTER — Ambulatory Visit (INDEPENDENT_AMBULATORY_CARE_PROVIDER_SITE_OTHER): Admitting: Surgery

## 2023-12-02 ENCOUNTER — Encounter: Payer: Self-pay | Admitting: Surgery

## 2023-12-02 ENCOUNTER — Other Ambulatory Visit: Payer: Self-pay

## 2023-12-02 VITALS — BP 105/72 | HR 91 | Temp 98.1°F | Resp 16 | Ht 73.0 in | Wt 181.0 lb

## 2023-12-02 DIAGNOSIS — Z09 Encounter for follow-up examination after completed treatment for conditions other than malignant neoplasm: Secondary | ICD-10-CM

## 2023-12-05 NOTE — Anesthesia Postprocedure Evaluation (Signed)
 Anesthesia Post Note  Patient: Roy Freeman  Procedure(s) Performed: REPAIR, HERNIA, EXCISIONAL, ROBOT-ASSISTED (Abdomen)  Patient location during evaluation: Phase II Anesthesia Type: General Level of consciousness: awake Pain management: pain level controlled Vital Signs Assessment: post-procedure vital signs reviewed and stable Respiratory status: spontaneous breathing and respiratory function stable Cardiovascular status: blood pressure returned to baseline and stable Postop Assessment: no headache and no apparent nausea or vomiting Anesthetic complications: no Comments: Late entry   There were no known notable events for this encounter.   Last Vitals:  Vitals:   11/19/23 1145 11/19/23 1153  BP: (!) 132/94 (!) 141/87  Pulse: (!) 105 (!) 105  Resp: 19 (!) 22  Temp: 37.1 C 37.1 C  SpO2: 97% 93%    Last Pain:  Vitals:   11/19/23 1153  TempSrc:   PainSc: 7                  Coretha Dew

## 2023-12-06 NOTE — Progress Notes (Signed)
 [facility] Note   HPI:  23 y.o. Male presents to clinic for post-op follow-up status post robotic assisted laparoscopic incisional hernia repair with mesh on 5/16.  Patient overall has been doing well.  His pain has been well-controlled, he is tolerating a diet without nausea and vomiting, and he is moving his bowels without issue.  He denies issues at his incision sites.  Denies fevers and chills.  Review of Systems:  All other review of systems: otherwise negative   Vital Signs:  BP 105/72   Pulse 91   Temp 98.1 F (36.7 C) (Oral)   Resp 16   Ht 6\' 1"  (1.854 m)   Wt 181 lb (82.1 kg)   SpO2 96%   BMI 23.88 kg/m    Physical Exam:  Physical Exam Vitals reviewed.  Constitutional:      Appearance: Normal appearance.  Abdominal:     Comments: Abdomen soft, nondistended, no percussion tenderness, nontender to palpation; no rigidity, guarding, or rebound tenderness; laparoscopic incision sites healing well with small amount of glue still present  Neurological:     Mental Status: He is alert.     Laboratory studies: None   Imaging:  None   Assessment:  23 y.o. yo Male who presents for follow-up status post robotic assisted laparoscopic incisional hernia repair with mesh on 5/16.  Plan:  - Patient overall doing well since surgery.  Tolerating diet, moving his bowels, pain well-controlled - Advised that he can use antibiotic ointment or Vaseline on his incision sites prior to showering to help loosen the remaining skin glue - Also advised that he needs to continue to take it easy for at least another 2 weeks.  He is planning to stay out of work until the end of June.  I told him that once he is at 4 weeks out from surgery, he should slowly try to increase his activities to what he needs to do for his job.  If we need to excuse him for a longer period of time we can do that - Follow up with me as needed  All of the above recommendations were discussed with the  patient, and all of patient's questions were answered to his expressed satisfaction.  Note: Portions of this report may have been transcribed using voice recognition software. Every effort has been made to ensure accuracy; however, inadvertent computerized transcription errors may still be present.   Lidia Reels, DO [facility] [address] [place], [place] [address] [phone] (office)
